# Patient Record
Sex: Female | Born: 1975 | Race: White | Hispanic: No | Marital: Married | Smoking: Never smoker
Health system: Southern US, Community
[De-identification: ages and names within clinical notes are randomized; demographics above are authoritative.]

---

## 2009-02-13 ENCOUNTER — Emergency Department: Payer: Self-pay | Admitting: Emergency Medicine

## 2009-10-12 ENCOUNTER — Emergency Department: Payer: Self-pay | Admitting: Emergency Medicine

## 2010-03-10 ENCOUNTER — Emergency Department: Payer: Self-pay | Admitting: Emergency Medicine

## 2010-09-07 ENCOUNTER — Emergency Department: Payer: Self-pay | Admitting: Emergency Medicine

## 2010-10-02 ENCOUNTER — Emergency Department: Payer: Self-pay | Admitting: Emergency Medicine

## 2010-10-11 ENCOUNTER — Ambulatory Visit: Payer: Self-pay | Admitting: Internal Medicine

## 2010-10-28 ENCOUNTER — Ambulatory Visit: Payer: Self-pay | Admitting: Cardiothoracic Surgery

## 2010-10-31 ENCOUNTER — Ambulatory Visit: Payer: Self-pay | Admitting: Internal Medicine

## 2010-11-06 ENCOUNTER — Ambulatory Visit: Payer: Self-pay | Admitting: Cardiothoracic Surgery

## 2010-11-13 ENCOUNTER — Inpatient Hospital Stay: Payer: Self-pay | Admitting: Cardiothoracic Surgery

## 2010-11-20 LAB — PATHOLOGY REPORT

## 2010-12-01 ENCOUNTER — Ambulatory Visit: Payer: Self-pay | Admitting: Internal Medicine

## 2011-04-03 IMAGING — CR DG CHEST 1V PORT
1 series · 1 of 1 positions shown · non-contrast
Comparison: none

REASON FOR EXAM: status post left anterior thoracotomy for biopsy
COMMENTS:

PROCEDURE:     DXR - DXR PORTABLE CHEST SINGLE VIEW  - November 13, 2010  [DATE]
RESULT:     Comparison: 10/28/2010

[view not recorded]
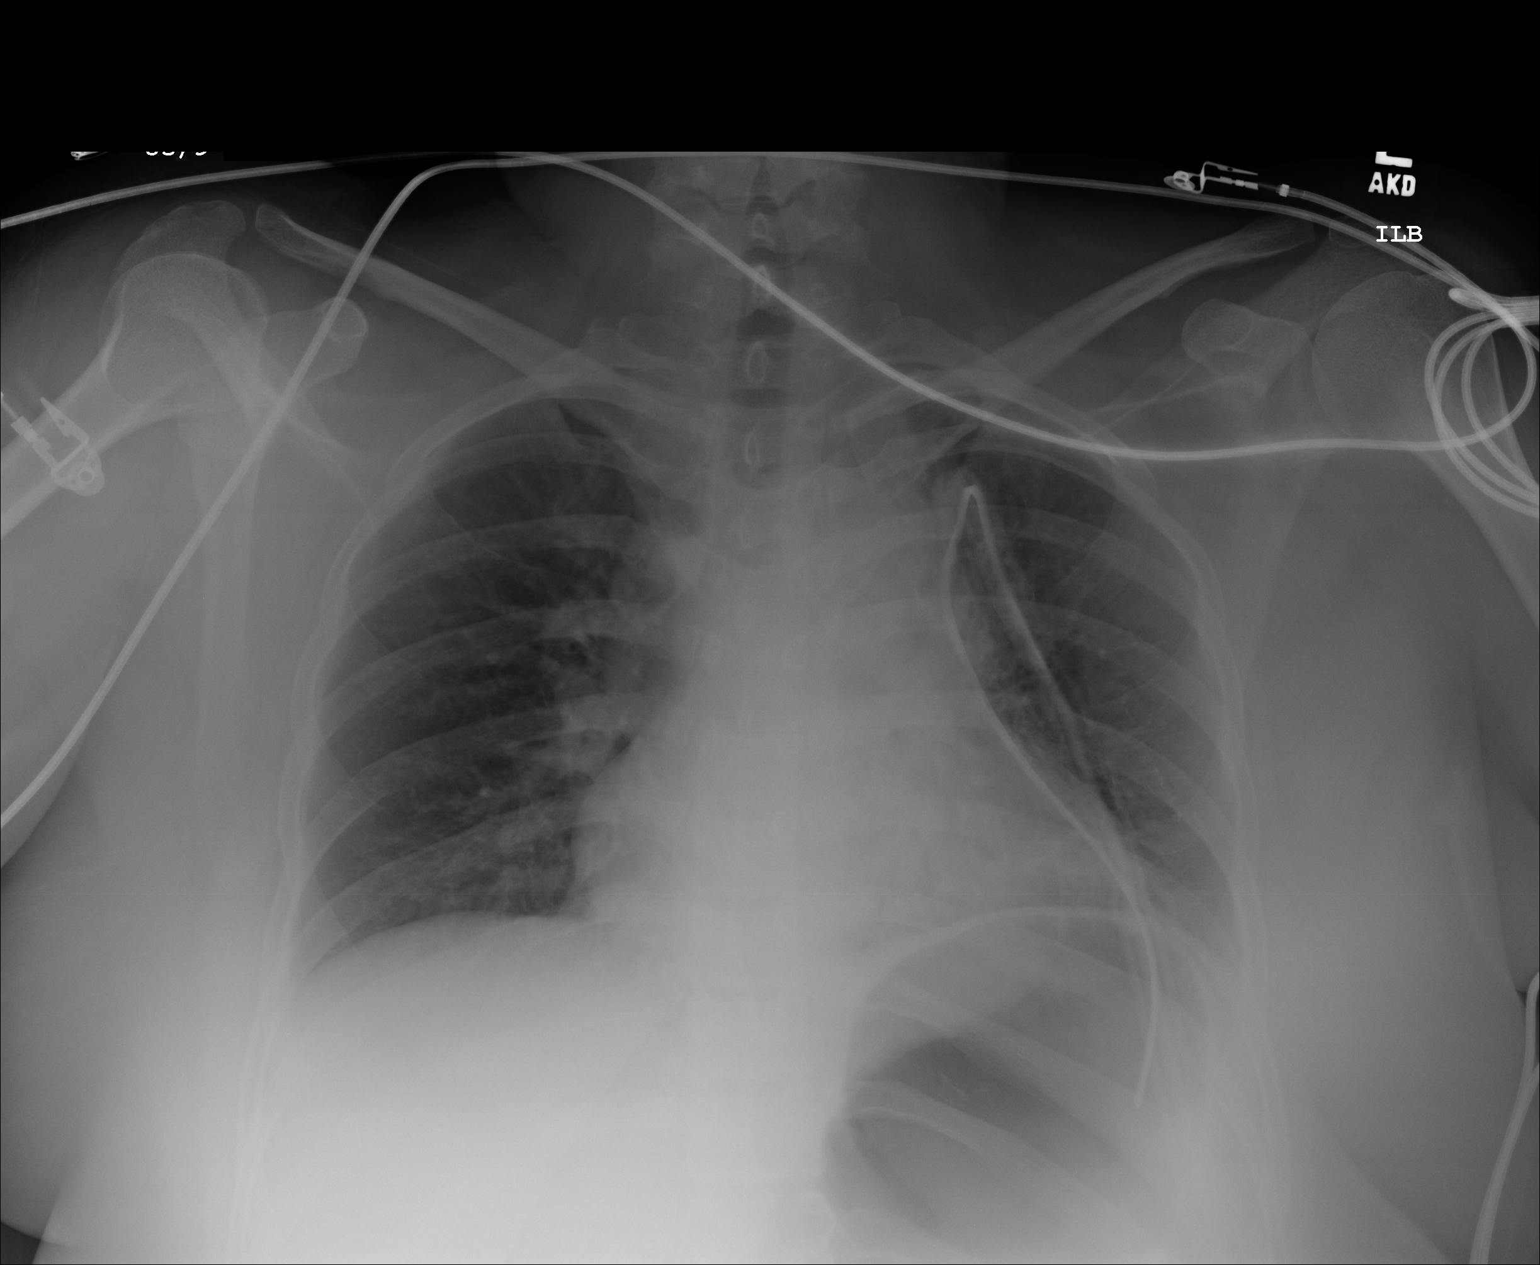

[1 of 1 positions shown; findings below may reference images not displayed]

FINDINGS: The lung volumes are low. There is a left chest tube in place. There may be
a small left pneumothorax. Minimal basilar opacities likely represent
atelectasis.
IMPRESSION: Left chest tube in place. Possible small left pneumothorax.

## 2018-07-02 ENCOUNTER — Other Ambulatory Visit: Payer: Self-pay | Admitting: Family Medicine

## 2018-07-02 DIAGNOSIS — Z1231 Encounter for screening mammogram for malignant neoplasm of breast: Secondary | ICD-10-CM

## 2018-07-28 ENCOUNTER — Ambulatory Visit: Payer: BLUE CROSS/BLUE SHIELD | Attending: Family Medicine

## 2018-12-07 DIAGNOSIS — F410 Panic disorder [episodic paroxysmal anxiety] without agoraphobia: Secondary | ICD-10-CM

## 2018-12-07 HISTORY — DX: Panic disorder (episodic paroxysmal anxiety): F41.0

## 2020-12-31 ENCOUNTER — Other Ambulatory Visit: Payer: Self-pay | Admitting: Family Medicine

## 2020-12-31 DIAGNOSIS — Z1231 Encounter for screening mammogram for malignant neoplasm of breast: Secondary | ICD-10-CM

## 2021-01-04 ENCOUNTER — Other Ambulatory Visit: Payer: Self-pay

## 2021-01-04 ENCOUNTER — Ambulatory Visit
Admission: RE | Admit: 2021-01-04 | Discharge: 2021-01-04 | Disposition: A | Discharge status: Home / Self Care | Payer: BC Managed Care – PPO | Source: Ambulatory Visit | Attending: Family Medicine | Admitting: Family Medicine

## 2021-01-04 DIAGNOSIS — Z1231 Encounter for screening mammogram for malignant neoplasm of breast: Secondary | ICD-10-CM | POA: Diagnosis not present

## 2021-01-09 ENCOUNTER — Other Ambulatory Visit: Payer: Self-pay | Admitting: Family Medicine

## 2021-01-09 DIAGNOSIS — N6489 Other specified disorders of breast: Secondary | ICD-10-CM

## 2021-01-09 DIAGNOSIS — R921 Mammographic calcification found on diagnostic imaging of breast: Secondary | ICD-10-CM

## 2021-01-09 DIAGNOSIS — R928 Other abnormal and inconclusive findings on diagnostic imaging of breast: Secondary | ICD-10-CM

## 2021-01-11 ENCOUNTER — Ambulatory Visit
Admission: RE | Admit: 2021-01-11 | Discharge: 2021-01-11 | Disposition: A | Discharge status: Home / Self Care | Payer: BC Managed Care – PPO | Source: Ambulatory Visit | Attending: Family Medicine | Admitting: Family Medicine

## 2021-01-11 ENCOUNTER — Other Ambulatory Visit: Payer: Self-pay

## 2021-01-11 DIAGNOSIS — N6489 Other specified disorders of breast: Secondary | ICD-10-CM | POA: Insufficient documentation

## 2021-01-11 DIAGNOSIS — R928 Other abnormal and inconclusive findings on diagnostic imaging of breast: Secondary | ICD-10-CM | POA: Insufficient documentation

## 2021-01-11 DIAGNOSIS — R921 Mammographic calcification found on diagnostic imaging of breast: Secondary | ICD-10-CM

## 2021-01-16 ENCOUNTER — Other Ambulatory Visit: Payer: Self-pay | Admitting: Family Medicine

## 2021-01-16 DIAGNOSIS — N6489 Other specified disorders of breast: Secondary | ICD-10-CM

## 2021-01-16 DIAGNOSIS — R921 Mammographic calcification found on diagnostic imaging of breast: Secondary | ICD-10-CM

## 2021-01-16 DIAGNOSIS — R928 Other abnormal and inconclusive findings on diagnostic imaging of breast: Secondary | ICD-10-CM

## 2021-01-18 ENCOUNTER — Ambulatory Visit
Admission: RE | Admit: 2021-01-18 | Discharge: 2021-01-18 | Disposition: A | Discharge status: Home / Self Care | Payer: BC Managed Care – PPO | Source: Ambulatory Visit | Attending: Family Medicine | Admitting: Family Medicine

## 2021-01-18 ENCOUNTER — Other Ambulatory Visit: Payer: Self-pay

## 2021-01-18 DIAGNOSIS — R928 Other abnormal and inconclusive findings on diagnostic imaging of breast: Secondary | ICD-10-CM | POA: Diagnosis not present

## 2021-01-18 DIAGNOSIS — N6489 Other specified disorders of breast: Secondary | ICD-10-CM

## 2021-01-18 DIAGNOSIS — R921 Mammographic calcification found on diagnostic imaging of breast: Secondary | ICD-10-CM | POA: Insufficient documentation

## 2021-01-18 HISTORY — PX: BREAST BIOPSY: SHX20

## 2021-01-21 LAB — SURGICAL PATHOLOGY

## 2021-06-08 IMAGING — MG MM BREAST BX W LOC DEV 1ST LESION IMAGE BX SPEC STEREO GUIDE*L*
7 of 9 series · 7 of 17 positions shown · non-contrast
Comparison: Previous exams.
COMPARISON: Previous exams.

Addendum:
CLINICAL DATA: 44-year-old female presenting for stereotactic
biopsy of calcifications and an asymmetry in the left breast.

EXAM:
BREAST STEREOTACTIC CORE NEEDLE BIOPSY

[L (1 of 6)]
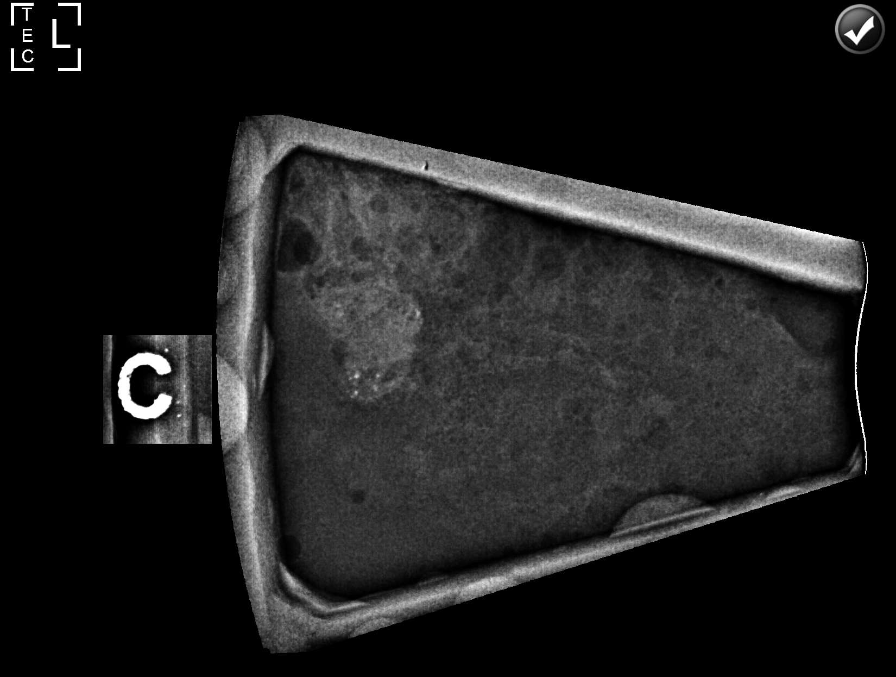

[L (2 of 6)]
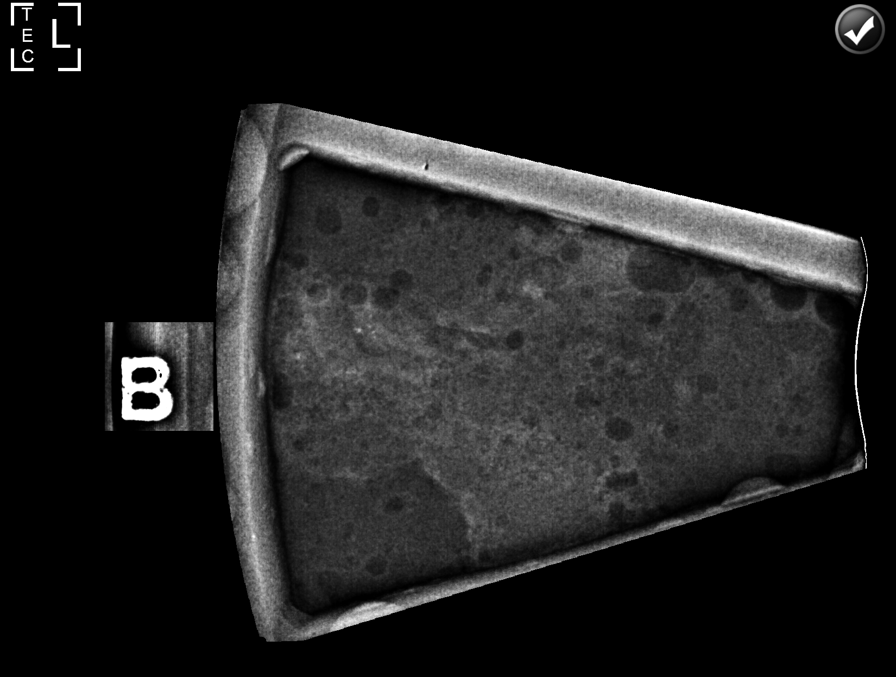

[L (3 of 6)]
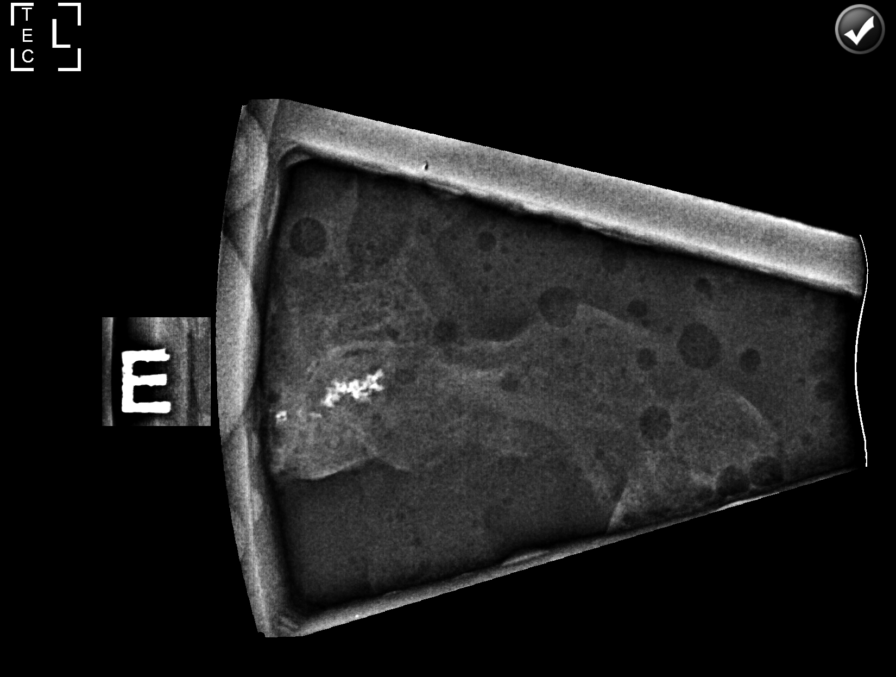

[L (4 of 6)]
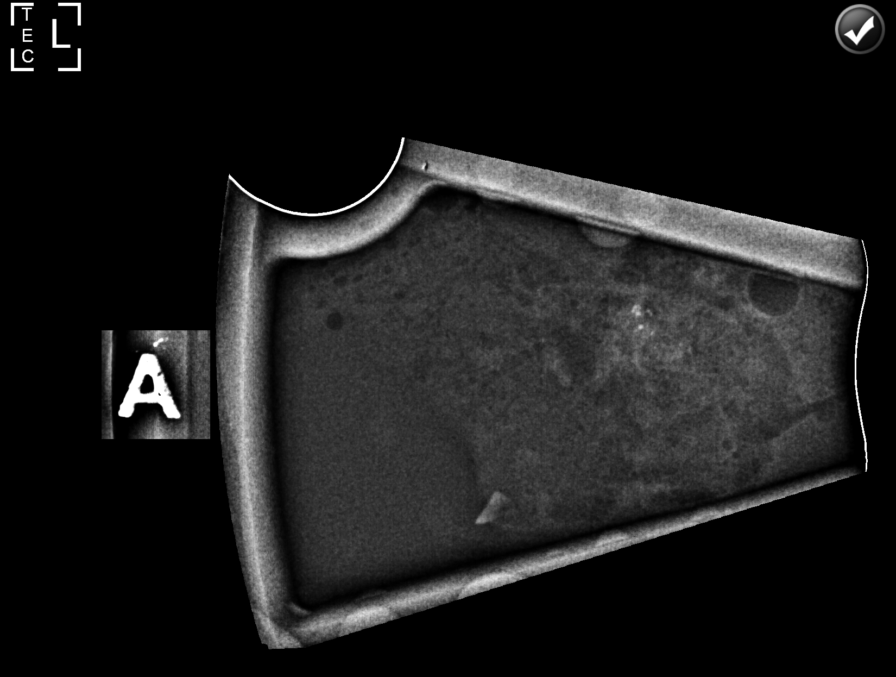

[L (5 of 6)]
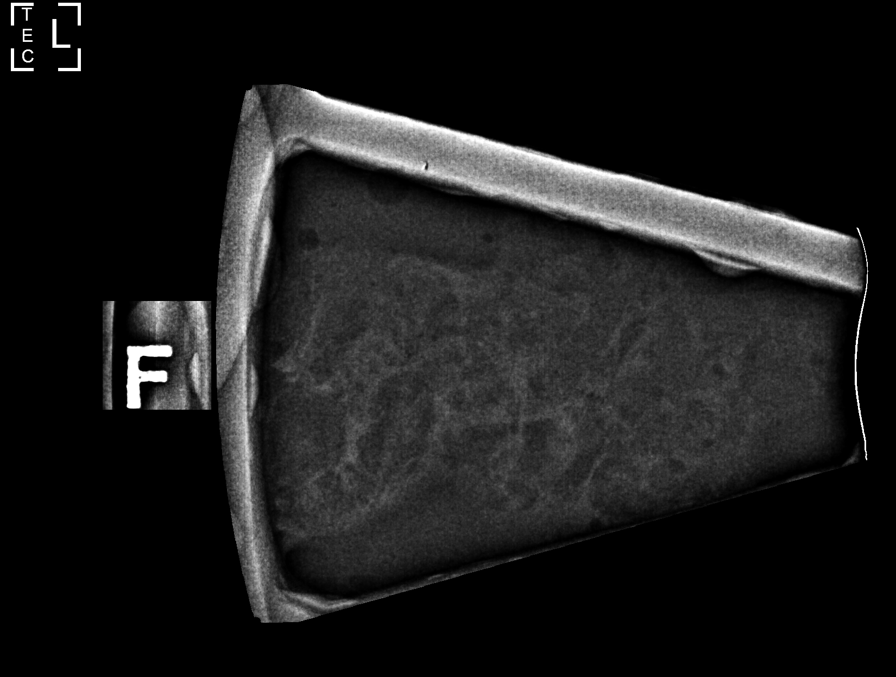

[L (6 of 6)]
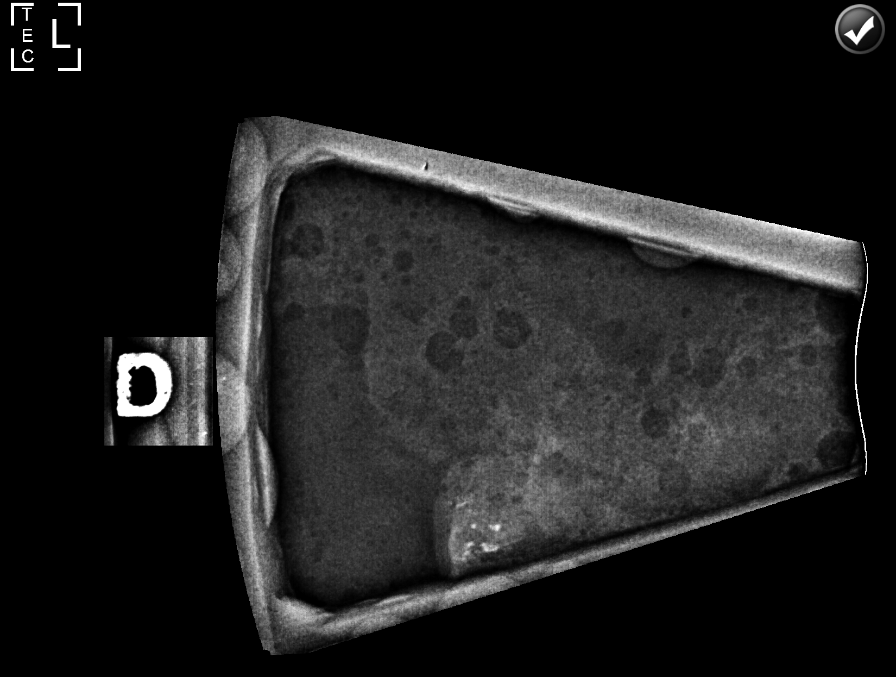

[L LM]
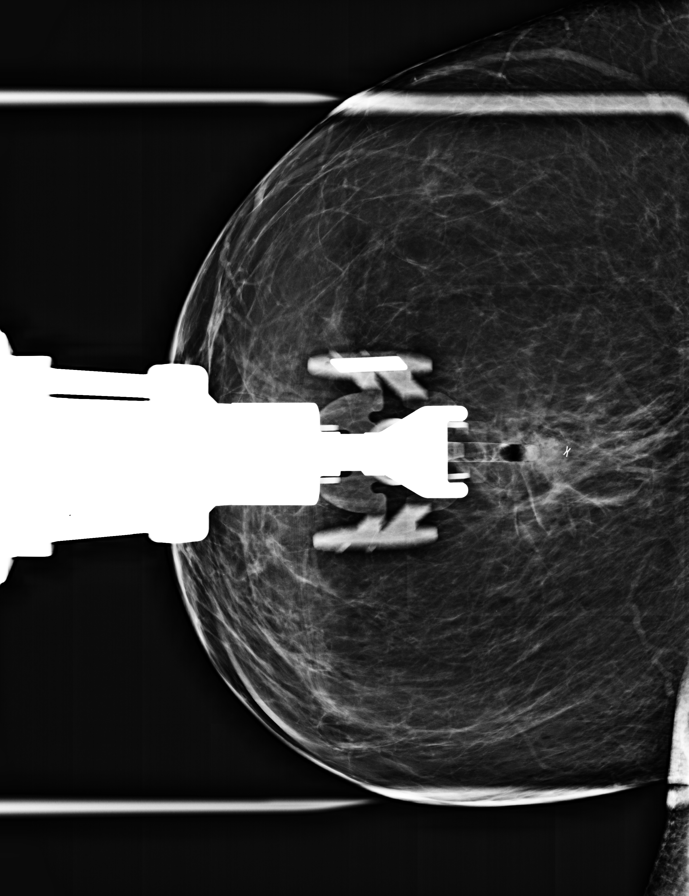

[7 of 17 positions shown; findings below may reference images not displayed]



#1 Lesion quadrant: Upper outer quadrant

Using sterile technique and 1% Lidocaine as local anesthetic, under
stereotactic guidance, a 9 gauge vacuum assisted device was used to
perform core needle biopsy of calcifications in the upper-outer
quadrant of the left breast using a lateral approach. Specimen
radiograph was performed showing calcifications in multiple core
samples. Specimens with calcifications are identified for pathology.

At the conclusion of the procedure, an X shaped tissue marker clip
was deployed into the biopsy cavity.

----------------------------

#2 Lesion quadrant: Upper outer quadrant

Using sterile technique and 1% Lidocaine as local anesthetic, under
stereotactic guidance, a 9 gauge vacuum assisted device was used to
perform core needle biopsy of an asymmetry in the upper-outer left
breast using a superior approach.

At the conclusion of the procedure, a ribbon shaped tissue marker
clip was deployed into the biopsy cavity.

Follow-up 2-view mammogram was performed and dictated separately.
IMPRESSION: 1. Stereotactic-guided biopsy of calcifications in the upper-outer
left breast. No apparent complications.

2. Stereotactic-guided biopsy of an asymmetry in the upper-outer
left breast. No apparent complications.

ADDENDUM:
PATHOLOGY revealed: Site A. BREAST, LEFT UPPER OUTER QUADRANT,
LESION #1; STEREOTACTIC CORE NEEDLE BIOPSY: - BENIGN MAMMARY
PARENCHYMA WITH FIBROADENOMATOID CHANGES AND ASSOCIATED
CALCIFICATIONS. - NEGATIVE FOR ATYPICAL PROLIFERATIVE BREAST
DISEASE.

Pathology results are CONCORDANT with imaging findings, per Dr.
Fareedullah Samand.

PATHOLOGY revealed: Site B. BREAST, LEFT UPPER OUTER QUADRANT,
LESION #2; STEREOTACTIC CORE NEEDLE BIOPSY: - BENIGN MAMMARY
PARENCHYMA WITH MILD STROMAL FIBROSIS AND FIBROCYSTIC CHANGES. -
NEGATIVE FOR ATYPICAL PROLIFERATIVE BREAST DISEASE.

Pathology results are CONCORDANT with imaging findings, per Dr.
Fareedullah Samand.

Pathology results and recommendations below were discussed with
patient by telephone on 01/21/2021. Patient reported biopsy site with
"bruising the diameter of a tennis ball, but otherwise doing fine."
Post biopsy care instructions were reviewed, questions were answered
and my direct phone number was provided to patient. Patient was
instructed to call [HOSPITAL] if bruising worsens or
other adverse symptoms arise.

Recommendation: Patient to return in six months for unilateral LEFT
breast diagnostic mammogram to ensure stability of LEFT breast
biopsy sites. The patient informed a reminder notice will be sent
regarding this appointment and she will need to call mammography
site to schedule this appointment.

Pathology results reported by Beranu Cairo RN on 01/21/2021.



#1 Lesion quadrant: Upper outer quadrant

Using sterile technique and 1% Lidocaine as local anesthetic, under
stereotactic guidance, a 9 gauge vacuum assisted device was used to
perform core needle biopsy of calcifications in the upper-outer
quadrant of the left breast using a lateral approach. Specimen
radiograph was performed showing calcifications in multiple core
samples. Specimens with calcifications are identified for pathology.

At the conclusion of the procedure, an X shaped tissue marker clip
was deployed into the biopsy cavity.

----------------------------

#2 Lesion quadrant: Upper outer quadrant

Using sterile technique and 1% Lidocaine as local anesthetic, under
stereotactic guidance, a 9 gauge vacuum assisted device was used to
perform core needle biopsy of an asymmetry in the upper-outer left
breast using a superior approach.

At the conclusion of the procedure, a ribbon shaped tissue marker
clip was deployed into the biopsy cavity.

Follow-up 2-view mammogram was performed and dictated separately.
IMPRESSION: 1. Stereotactic-guided biopsy of calcifications in the upper-outer
left breast. No apparent complications.

2. Stereotactic-guided biopsy of an asymmetry in the upper-outer
left breast. No apparent complications.

## 2021-06-08 IMAGING — MG MM BREAST BX W LOC DEV EA AD LESION IMG BX SPEC STEREO GUIDE*L*
7 of 10 series · 7 of 22 positions shown · non-contrast
Comparison: Previous exams.
COMPARISON: Previous exams.

Addendum:
CLINICAL DATA: 44-year-old female presenting for stereotactic
biopsy of calcifications and an asymmetry in the left breast.

EXAM:
BREAST STEREOTACTIC CORE NEEDLE BIOPSY

[L (1 of 6)]
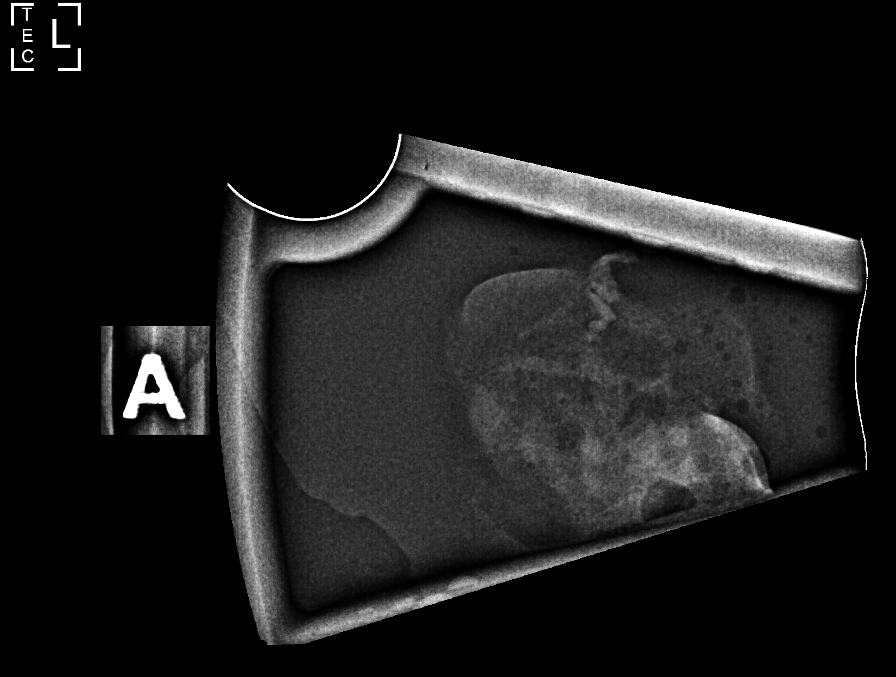

[L (2 of 6)]
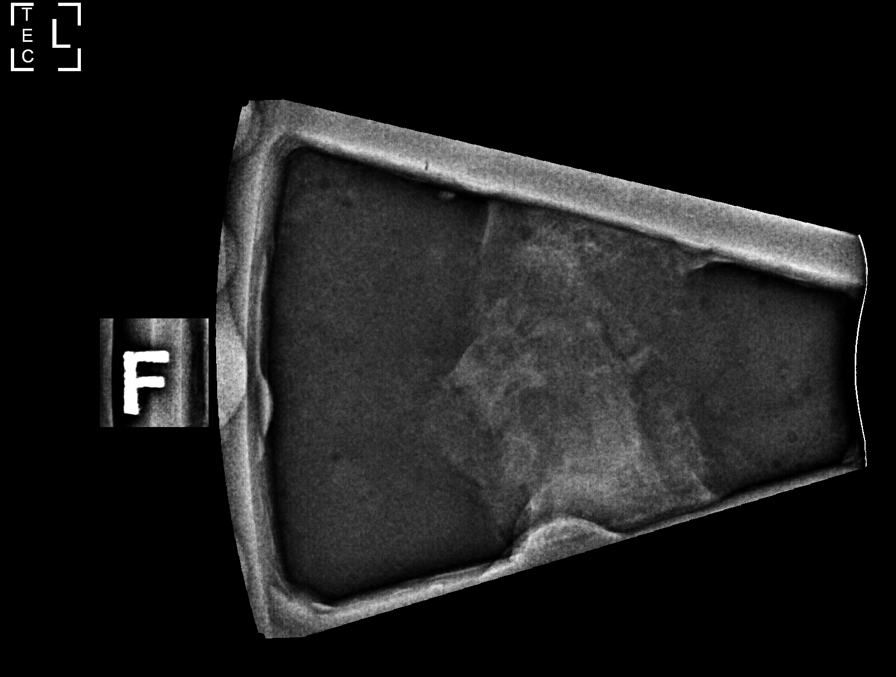

[L (3 of 6)]
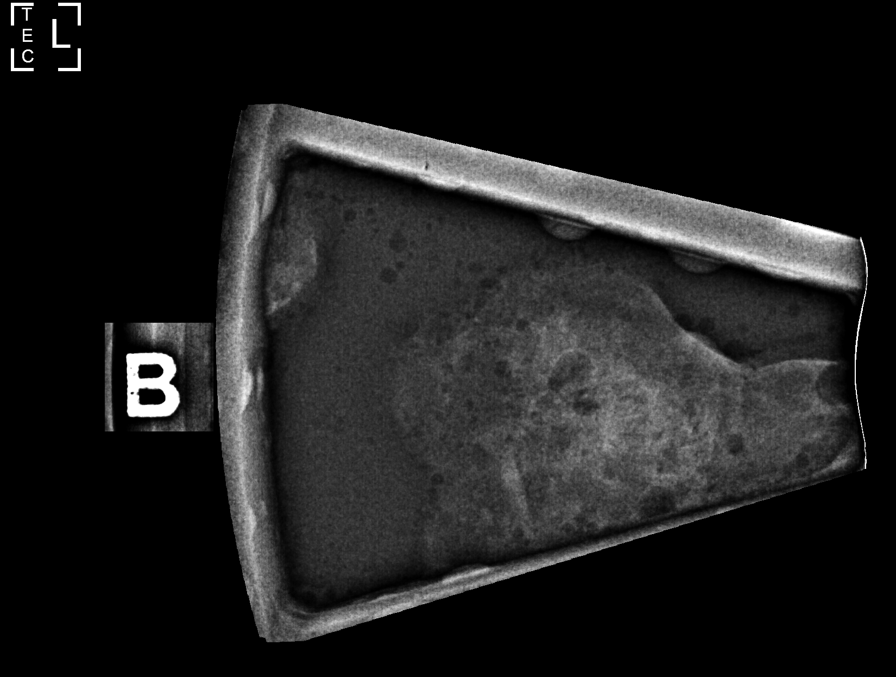

[L (4 of 6)]
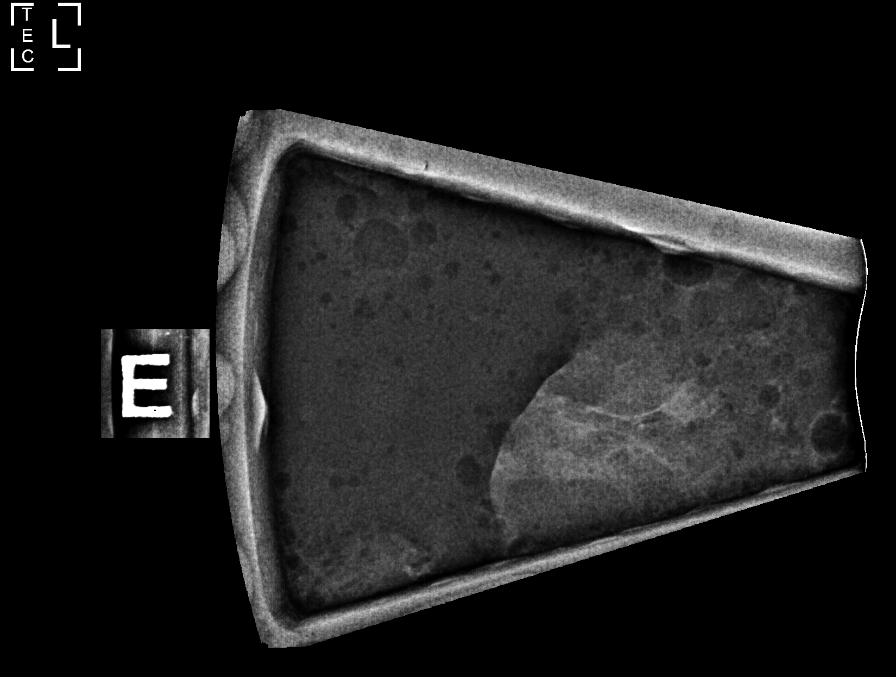

[L (5 of 6)]
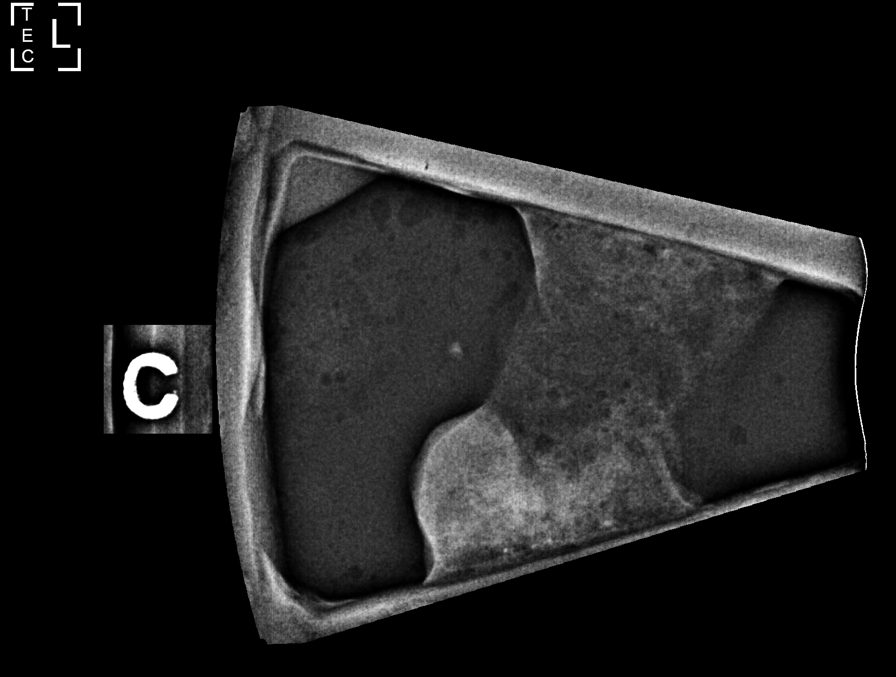

[L (6 of 6)]
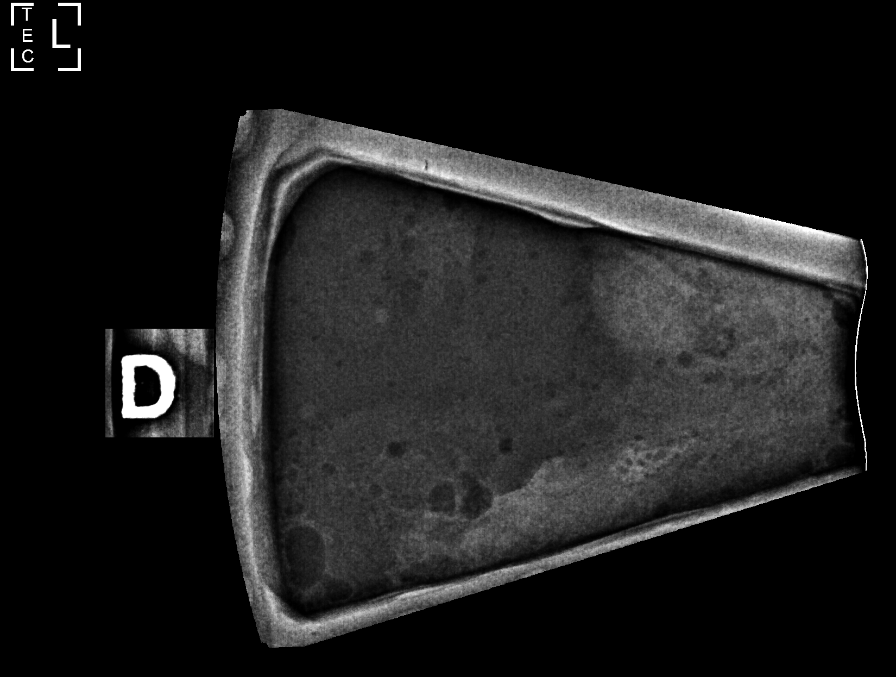

[L CC]
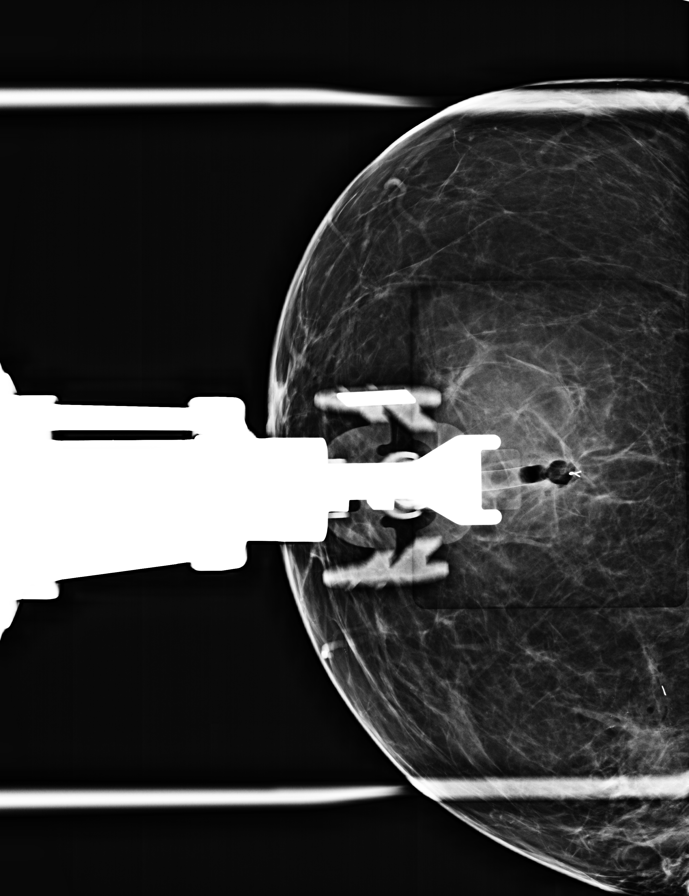

[7 of 22 positions shown; findings below may reference images not displayed]



#1 Lesion quadrant: Upper outer quadrant

Using sterile technique and 1% Lidocaine as local anesthetic, under
stereotactic guidance, a 9 gauge vacuum assisted device was used to
perform core needle biopsy of calcifications in the upper-outer
quadrant of the left breast using a lateral approach. Specimen
radiograph was performed showing calcifications in multiple core
samples. Specimens with calcifications are identified for pathology.

At the conclusion of the procedure, an X shaped tissue marker clip
was deployed into the biopsy cavity.

----------------------------

#2 Lesion quadrant: Upper outer quadrant

Using sterile technique and 1% Lidocaine as local anesthetic, under
stereotactic guidance, a 9 gauge vacuum assisted device was used to
perform core needle biopsy of an asymmetry in the upper-outer left
breast using a superior approach.

At the conclusion of the procedure, a ribbon shaped tissue marker
clip was deployed into the biopsy cavity.

Follow-up 2-view mammogram was performed and dictated separately.
IMPRESSION: 1. Stereotactic-guided biopsy of calcifications in the upper-outer
left breast. No apparent complications.

2. Stereotactic-guided biopsy of an asymmetry in the upper-outer
left breast. No apparent complications.

ADDENDUM:
PATHOLOGY revealed: Site A. BREAST, LEFT UPPER OUTER QUADRANT,
LESION #1; STEREOTACTIC CORE NEEDLE BIOPSY: - BENIGN MAMMARY
PARENCHYMA WITH FIBROADENOMATOID CHANGES AND ASSOCIATED
CALCIFICATIONS. - NEGATIVE FOR ATYPICAL PROLIFERATIVE BREAST
DISEASE.

Pathology results are CONCORDANT with imaging findings, per Dr.
Fareedullah Samand.

PATHOLOGY revealed: Site B. BREAST, LEFT UPPER OUTER QUADRANT,
LESION #2; STEREOTACTIC CORE NEEDLE BIOPSY: - BENIGN MAMMARY
PARENCHYMA WITH MILD STROMAL FIBROSIS AND FIBROCYSTIC CHANGES. -
NEGATIVE FOR ATYPICAL PROLIFERATIVE BREAST DISEASE.

Pathology results are CONCORDANT with imaging findings, per Dr.
Fareedullah Samand.

Pathology results and recommendations below were discussed with
patient by telephone on 01/21/2021. Patient reported biopsy site with
"bruising the diameter of a tennis ball, but otherwise doing fine."
Post biopsy care instructions were reviewed, questions were answered
and my direct phone number was provided to patient. Patient was
instructed to call [HOSPITAL] if bruising worsens or
other adverse symptoms arise.

Recommendation: Patient to return in six months for unilateral LEFT
breast diagnostic mammogram to ensure stability of LEFT breast
biopsy sites. The patient informed a reminder notice will be sent
regarding this appointment and she will need to call mammography
site to schedule this appointment.

Pathology results reported by Beranu Cairo RN on 01/21/2021.



#1 Lesion quadrant: Upper outer quadrant

Using sterile technique and 1% Lidocaine as local anesthetic, under
stereotactic guidance, a 9 gauge vacuum assisted device was used to
perform core needle biopsy of calcifications in the upper-outer
quadrant of the left breast using a lateral approach. Specimen
radiograph was performed showing calcifications in multiple core
samples. Specimens with calcifications are identified for pathology.

At the conclusion of the procedure, an X shaped tissue marker clip
was deployed into the biopsy cavity.

----------------------------

#2 Lesion quadrant: Upper outer quadrant

Using sterile technique and 1% Lidocaine as local anesthetic, under
stereotactic guidance, a 9 gauge vacuum assisted device was used to
perform core needle biopsy of an asymmetry in the upper-outer left
breast using a superior approach.

At the conclusion of the procedure, a ribbon shaped tissue marker
clip was deployed into the biopsy cavity.

Follow-up 2-view mammogram was performed and dictated separately.
IMPRESSION: 1. Stereotactic-guided biopsy of calcifications in the upper-outer
left breast. No apparent complications.

2. Stereotactic-guided biopsy of an asymmetry in the upper-outer
left breast. No apparent complications.

## 2021-09-24 DIAGNOSIS — I10 Essential (primary) hypertension: Secondary | ICD-10-CM

## 2021-09-24 HISTORY — DX: Essential (primary) hypertension: I10

## 2022-10-22 DIAGNOSIS — L409 Psoriasis, unspecified: Secondary | ICD-10-CM

## 2022-10-22 DIAGNOSIS — N393 Stress incontinence (female) (male): Secondary | ICD-10-CM | POA: Insufficient documentation

## 2022-10-22 HISTORY — DX: Stress incontinence (female) (male): N39.3

## 2022-10-22 HISTORY — DX: Psoriasis, unspecified: L40.9

## 2022-10-27 ENCOUNTER — Other Ambulatory Visit: Payer: Self-pay | Admitting: Family Medicine

## 2022-10-27 DIAGNOSIS — N6489 Other specified disorders of breast: Secondary | ICD-10-CM

## 2022-10-27 DIAGNOSIS — R928 Other abnormal and inconclusive findings on diagnostic imaging of breast: Secondary | ICD-10-CM

## 2022-11-04 ENCOUNTER — Ambulatory Visit
Admission: RE | Admit: 2022-11-04 | Discharge: 2022-11-04 | Disposition: A | Discharge status: Home / Self Care | Payer: BC Managed Care – PPO | Source: Ambulatory Visit | Attending: Family Medicine | Admitting: Family Medicine

## 2022-11-04 DIAGNOSIS — N6489 Other specified disorders of breast: Secondary | ICD-10-CM | POA: Insufficient documentation

## 2022-11-04 DIAGNOSIS — R928 Other abnormal and inconclusive findings on diagnostic imaging of breast: Secondary | ICD-10-CM

## 2022-11-11 ENCOUNTER — Ambulatory Visit: Payer: Self-pay | Admitting: Urology

## 2022-12-22 ENCOUNTER — Ambulatory Visit: Payer: BC Managed Care – PPO | Admitting: Urology

## 2022-12-22 ENCOUNTER — Encounter: Payer: Self-pay | Admitting: Urology

## 2022-12-22 DIAGNOSIS — N393 Stress incontinence (female) (male): Secondary | ICD-10-CM

## 2022-12-22 DIAGNOSIS — N3946 Mixed incontinence: Secondary | ICD-10-CM

## 2022-12-22 LAB — URINALYSIS, COMPLETE
Bilirubin, UA: NEGATIVE
Glucose, UA: NEGATIVE
Ketones, UA: NEGATIVE
Leukocytes,UA: NEGATIVE
Nitrite, UA: NEGATIVE
Protein,UA: NEGATIVE
RBC, UA: NEGATIVE
Specific Gravity, UA: 1.025 (ref 1.005–1.030)
Urobilinogen, Ur: 1 mg/dL (ref 0.2–1.0)
pH, UA: 5.5 (ref 5.0–7.5)

## 2022-12-22 LAB — MICROSCOPIC EXAMINATION

## 2022-12-22 NOTE — Patient Instructions (Signed)

## 2022-12-22 NOTE — Progress Notes (Signed)
12/22/2022 2:44 PM   Sandra Adkins 25-Jun-1976 [id number]  Referring provider: Jerl Mina, MD [address] [facility] [place],  [place] [address]  Chief Complaint  Patient presents with   New Patient (Initial Visit)   Urinary Incontinence    HPI: Consulted to assess the patient's worsening urinary incontinence.  Years she was wearing a liner for mild stress incontinence.  Now she leaks with coughing sneezing bending lifting.  She has urge incontinence.  Both are significant.  She has mild bedwetting.  She wears 3 pads a day that can be quite wet  Flow was good.  Has not had a hysterectomy.  No neurologic issues.  Bowel function normal.  No treatment.  No history of bladder surgery kidney stones or bladder infections   PMH: No past medical history on file.  Surgical History: Past Surgical History:  Procedure Laterality Date   BREAST BIOPSY Left 01/18/2021   stereo bx/ x clip/ neg   BREAST BIOPSY Left 01/18/2021   stereo bx/ ribbon clip/ neg    Home Medications:  Allergies as of 12/22/2022   No Known Allergies      Medication List        Accurate as of December 22, 2022  2:44 PM. If you have any questions, ask your nurse or doctor.          citalopram 20 MG tablet Commonly known as: CELEXA Take 20 mg by mouth daily.   olmesartan 40 MG tablet Commonly known as: BENICAR Take by mouth.        Allergies: No Known Allergies  Family History: No family history on file.  Social History:  reports that she has never smoked. She has never been exposed to tobacco smoke. She has never used smokeless tobacco. No history on file for alcohol use and drug use.  ROS:                                        Physical Exam: There were no vitals taken for this visit.  Constitutional:  Alert and oriented, No acute distress. HEENT: [place] AT, moist mucus membranes.  Trachea midline, no masses. Cardiovascular: No clubbing, cyanosis, or  edema. Respiratory: Normal respiratory effort, no increased work of breathing. GI: Abdomen is soft, nontender, nondistended, no abdominal masses GU: Pelvic examination patient had mild grade 2 hypermobility the bladder neck and leaked a few drops with a moderate cough.  No cystocele and small grade 1 rectocele Skin: No rashes, bruises or suspicious lesions. Lymph: No cervical or inguinal adenopathy. Neurologic: Grossly intact, no focal deficits, moving all 4 extremities. Psychiatric: Normal mood and affect.  Laboratory Data: No results found for: "WBC", "HGB", "HCT", "MCV", "PLT"  No results found for: "CREATININE"  No results found for: "PSA"  No results found for: "TESTOSTERONE"  No results found for: "HGBA1C"  Urinalysis No results found for: "COLORURINE", "APPEARANCEUR", "LABSPEC", "PHURINE", "GLUCOSEU", "HGBUR", "BILIRUBINUR", "KETONESUR", "PROTEINUR", "UROBILINOGEN", "NITRITE", "LEUKOCYTESUR"  Pertinent Imaging: Urine reviewed.  Urine sent for culture.  Chart reviewed  Assessment & Plan: Patient has mixed incontinence affecting her quality of life.  She does have mild bedwetting as well.  Things have worsened over a few years.  Role of urodynamics and cystoscopy discussed.  Proceed accordingly and call if culture positive  There are no diagnoses linked to this encounter.  No follow-ups on file.  Martina Sinner, MD  [facility] [address] [place],  [address] [phone]

## 2022-12-22 NOTE — Addendum Note (Signed)
Addended by: Sueanne Margarita on: 12/22/2022 03:25 PM   Modules accepted: Orders

## 2022-12-24 LAB — CULTURE, URINE COMPREHENSIVE

## 2023-02-23 ENCOUNTER — Ambulatory Visit: Payer: BC Managed Care – PPO | Admitting: Urology

## 2023-02-23 VITALS — BP 133/87 | HR 94

## 2023-02-23 DIAGNOSIS — N3946 Mixed incontinence: Secondary | ICD-10-CM

## 2023-02-23 DIAGNOSIS — N393 Stress incontinence (female) (male): Secondary | ICD-10-CM

## 2023-02-23 LAB — MICROSCOPIC EXAMINATION: Epithelial Cells (non renal): >10 /hpf — AB (ref 0–10)

## 2023-02-23 LAB — URINALYSIS, COMPLETE
Bilirubin, UA: NEGATIVE
Glucose, UA: NEGATIVE
Nitrite, UA: NEGATIVE
RBC, UA: NEGATIVE
Specific Gravity, UA: >1.03 — ABNORMAL HIGH (ref 1.005–1.030)
Urobilinogen, Ur: 1 mg/dL (ref 0.2–1.0)
pH, UA: 5.5 (ref 5.0–7.5)

## 2023-02-23 NOTE — Progress Notes (Signed)
02/23/2023 1:38 PM   Sandra Adkins 11/27/1975 [id number]  Referring provider: Jerl Mina, MD [address] [facility] [place],  [place] [address]  Chief Complaint  Patient presents with   Cysto    HPI: I was consulted to assess the patient's worsening urinary incontinence.  For years she was wearing a liner for mild stress incontinence.  Now she leaks with coughing sneezing bending lifting.  She has urge incontinence.  Both are significant.  She has mild bedwetting.  She wears 3 pads a day that can be quite wet   Flow was good.  Has not had a hysterectomy.    Pelvic examination patient had mild grade 2 hypermobility the bladder neck and leaked a few drops with a moderate cough. No cystocele and small grade 1 rectocele   Patient has mixed incontinence affecting her quality of life. She does have mild bedwetting as well. Things have worsened over a few years. Role of urodynamics and cystoscopy discussed. Proceed accordingly and call if culture positive   today Frequency stable.  Incontinence stable.  Last culture negative On urodynamics patient did not void and was catheterized for 25 mL.  Maximum bladder capacity was 372 mL.  Bladder was stable.  She had increased bladder sensation.  Her cough leak point pressure 200 mL was 172 cm of water with mild leakage.  Her cuff leak point pressure 300 mL was 164 cmH2O with moderate leakage.  Her Valsalva leak point pressure was 111 there is a water with mild to moderate leakage.  During voluntary voiding she voided 372 mL.  Maximal flow was 24 mL/s.  It was a prolonged pattern.  Voluntary contraction was intermittent and low pressure and not well-sustained.  Pressure was 10 cm of water.  Residual 0 mL.  EMG activity normal.  Bladder neck descent at less than a centimeter.  On pelvic examination patient had mild grade 2 hypermobility the bladder neck and leaked a small amount after cystoscopy with a cough  Cystoscopy: Bladder mucosa  and trigone were normal.  No cystitis.  No carcinoma.  No pain with bladder filling.  Tolerated procedure well     PMH: Past Medical History:  Diagnosis Date   Hypertension 09/24/2021   Panic attacks 12/07/2018   Psoriasis 10/22/2022   Stress incontinence in female 10/22/2022    Surgical History: Past Surgical History:  Procedure Laterality Date   BREAST BIOPSY Left 01/18/2021   stereo bx/ x clip/ neg   BREAST BIOPSY Left 01/18/2021   stereo bx/ ribbon clip/ neg    Home Medications:  Allergies as of 02/23/2023   No Known Allergies      Medication List        Accurate as of February 23, 2023  1:38 PM. If you have any questions, ask your nurse or doctor.          citalopram 20 MG tablet Commonly known as: CELEXA Take 20 mg by mouth daily.   olmesartan 40 MG tablet Commonly known as: BENICAR Take by mouth.        Allergies: No Known Allergies  Family History: No family history on file.  Social History:  reports that she has never smoked. She has never been exposed to tobacco smoke. She has never used smokeless tobacco. No history on file for alcohol use and drug use.  ROS:  Physical Exam: There were no vitals taken for this visit.  Constitutional:  Alert and oriented, No acute distress. HEENT: [place] AT, moist mucus membranes.  Trachea midline, no masses. Cardiovascular: No clubbing, cyanosis, or edema.   Laboratory Data: No results found for: "WBC", "HGB", "HCT", "MCV", "PLT"  No results found for: "CREATININE"  No results found for: "PSA"  No results found for: "TESTOSTERONE"  No results found for: "HGBA1C"  Urinalysis    Component Value Date/Time   APPEARANCEUR Clear 12/22/2022 1445   GLUCOSEU Negative 12/22/2022 1445   BILIRUBINUR Negative 12/22/2022 1445   PROTEINUR Negative 12/22/2022 1445   NITRITE Negative 12/22/2022 1445   LEUKOCYTESUR Negative 12/22/2022 1445     Pertinent Imaging: Urine reviewed and sent for culture.  Chart reviewed  Assessment & Plan: Patient has mixed incontinence but she said the stress incontinence is significantly impacting her quality life.  Her stress component was mild especially on urodynamics but she did have a positive cough test on repeat pelvic examinations.  Does have mild bedwetting secondary to overactive bladder component.  If she does not reach her treatment goal with medical and behavioral therapy which was offered today I would recommend a sling and bulking agent.  Patient will try Myrbetriq 50 mg samples and prescription but she held off on physical therapy but may consider in the future  1. Stress incontinence, female  - Urinalysis, Complete   No follow-ups on file.  Martina Sinner, MD  [facility] [address] [place], [place] [address] [phone]

## 2023-02-26 LAB — CULTURE, URINE COMPREHENSIVE

## 2023-04-06 ENCOUNTER — Ambulatory Visit: Payer: BC Managed Care – PPO | Admitting: Urology

## 2023-04-20 ENCOUNTER — Encounter: Payer: Self-pay | Admitting: Urology

## 2023-04-20 ENCOUNTER — Ambulatory Visit: Payer: BC Managed Care – PPO | Admitting: Urology

## 2023-06-01 ENCOUNTER — Ambulatory Visit: Payer: BC Managed Care – PPO | Admitting: Urology

## 2023-07-06 ENCOUNTER — Encounter: Payer: Self-pay | Admitting: Urology

## 2023-07-06 ENCOUNTER — Ambulatory Visit: Payer: BC Managed Care – PPO | Admitting: Urology

## 2023-07-06 VITALS — BP 131/85 | HR 103

## 2023-07-06 DIAGNOSIS — N393 Stress incontinence (female) (male): Secondary | ICD-10-CM

## 2023-07-06 DIAGNOSIS — N3946 Mixed incontinence: Secondary | ICD-10-CM

## 2023-07-06 LAB — URINALYSIS, COMPLETE
Bilirubin, UA: NEGATIVE
Glucose, UA: NEGATIVE
Leukocytes,UA: NEGATIVE
Nitrite, UA: NEGATIVE
Protein,UA: NEGATIVE
Specific Gravity, UA: >1.03 — ABNORMAL HIGH (ref 1.005–1.030)
Urobilinogen, Ur: 1 mg/dL (ref 0.2–1.0)
pH, UA: 5.5 (ref 5.0–7.5)

## 2023-07-06 LAB — MICROSCOPIC EXAMINATION: Epithelial Cells (non renal): >10 /[HPF] — AB (ref 0–10)

## 2023-07-06 MED ORDER — GEMTESA 75 MG PO TABS: 1.0000 | ORAL_TABLET | Freq: Every day | ORAL | 11 refills | Status: AC

## 2023-07-06 NOTE — Progress Notes (Signed)
07/06/2023 1:11 PM   TRYPHENA PERKOVICH 01/30/1976 [id number]  Referring provider: Jerl Mina, MD [address] [facility] [place],  [place] [address]  Chief Complaint  Patient presents with   Follow-up    HPI: I was consulted to assess the patient's worsening urinary incontinence.  For years she was wearing a liner for mild stress incontinence.  Now she leaks with coughing sneezing bending lifting.  She has urge incontinence.  Both are significant.  She has mild bedwetting.  She wears 3 pads a day that can be quite wet   Flow was good.  Has not had a hysterectomy.     Pelvic examination patient had mild grade 2 hypermobility the bladder neck and leaked a few drops with a moderate cough. No cystocele and small grade 1 rectocele    Patient has mixed incontinence affecting her quality of life. She does have mild bedwetting as well. Things have worsened over a few years.   On urodynamics patient did not void and was catheterized for 25 mL.  Maximum bladder capacity was 372 mL.  Bladder was stable.  She had increased bladder sensation.  Her cough leak point pressure 200 mL was 172 cm of water with mild leakage.  Her cuff leak point pressure 300 mL was 164 cmH2O with moderate leakage.  Her Valsalva leak point pressure was 111 there is a water with mild to moderate leakage.  During voluntary voiding she voided 372 mL.  Maximal flow was 24 mL/s.  It was a prolonged pattern.  Voluntary contraction was intermittent and low pressure and not well-sustained.  Pressure was 10 cm of water.  Residual 0 mL.  EMG activity normal.  Bladder neck descent at less than a centimeter.   On pelvic examination patient had mild grade 2 hypermobility the bladder neck and leaked a small amount after cystoscopy with a cough   Cystoscopy: Normal  Patient has mixed incontinence but she said the stress incontinence is significantly impacting her quality life. Her stress component was mild especially on  urodynamics but she did have a positive cough test on repeat pelvic examinations. Does have mild bedwetting secondary to overactive bladder component. If she does not reach her treatment goal with medical and behavioral therapy which was offered today I would recommend a sling and bulking agent. Patient will try Myrbetriq 50 mg samples and prescription but she held off on physical therapy but may consider in the future   Today Patient says she is dramatically better.  She is down to 1 light pad a day.  She has good days and bad days and if she lifts heavy will leak more.  Otherwise she is quite pleased.  Frequency stable.  Not infected clinically    PMH: Past Medical History:  Diagnosis Date   Hypertension 09/24/2021   Panic attacks 12/07/2018   Psoriasis 10/22/2022   Stress incontinence in female 10/22/2022    Surgical History: Past Surgical History:  Procedure Laterality Date   BREAST BIOPSY Left 01/18/2021   stereo bx/ x clip/ neg   BREAST BIOPSY Left 01/18/2021   stereo bx/ ribbon clip/ neg    Home Medications:  Allergies as of 07/06/2023   No Known Allergies      Medication List        Accurate as of July 06, 2023  1:11 PM. If you have any questions, ask your nurse or doctor.          citalopram 20 MG tablet Commonly  known as: CELEXA Take 20 mg by mouth daily.   olmesartan 40 MG tablet Commonly known as: BENICAR Take by mouth.        Allergies: No Known Allergies  Family History: No family history on file.  Social History:  reports that she has never smoked. She has never been exposed to tobacco smoke. She has never used smokeless tobacco. No history on file for alcohol use and drug use.  ROS:                                        Physical Exam: BP 131/85   Pulse (!) 103   Constitutional:  Alert and oriented, No acute distress. HEENT: [place] AT, moist mucus membranes.  Trachea midline, no masses. Cardiovascular: No  clubbing, cyanosis, or edema.  Laboratory Data: No results found for: "WBC", "HGB", "HCT", "MCV", "PLT"  No results found for: "CREATININE"  No results found for: "PSA"  No results found for: "TESTOSTERONE"  No results found for: "HGBA1C"  Urinalysis    Component Value Date/Time   APPEARANCEUR Clear 02/23/2023 1333   GLUCOSEU Negative 02/23/2023 1333   BILIRUBINUR Negative 02/23/2023 1333   PROTEINUR Trace (A) 02/23/2023 1333   NITRITE Negative 02/23/2023 1333   LEUKOCYTESUR Trace (A) 02/23/2023 1333    Pertinent Imaging:   Assessment & Plan: 2 weeks of samples given.  Reassess durability in 4 months on Gemtesa prescription.  Reassess goals and  1. Stress incontinence, female  - Urinalysis, Complete   No follow-ups on file.  Martina Sinner, MD  [facility] [address] [place], [place] [address] [phone]

## 2023-11-02 ENCOUNTER — Ambulatory Visit: Payer: Self-pay | Admitting: Urology

## 2023-11-23 ENCOUNTER — Other Ambulatory Visit: Payer: Self-pay | Admitting: Family Medicine

## 2023-11-23 DIAGNOSIS — Z1231 Encounter for screening mammogram for malignant neoplasm of breast: Secondary | ICD-10-CM

## 2023-12-08 ENCOUNTER — Encounter

## 2023-12-14 ENCOUNTER — Ambulatory Visit: Payer: BC Managed Care – PPO | Admitting: Urology

## 2024-02-15 ENCOUNTER — Ambulatory Visit: Admitting: Urology
